# Patient Record
Sex: Female | Born: 1960 | Hispanic: Yes | Marital: Married | Smoking: Never smoker
Health system: Southern US, Community
[De-identification: ages and names within clinical notes are randomized; demographics above are authoritative.]

## PROBLEM LIST (undated history)

## (undated) DIAGNOSIS — E119 Type 2 diabetes mellitus without complications: Secondary | ICD-10-CM

## (undated) DIAGNOSIS — I10 Essential (primary) hypertension: Secondary | ICD-10-CM

---

## 2009-04-29 ENCOUNTER — Ambulatory Visit: Payer: Self-pay | Admitting: Internal Medicine

## 2012-12-04 ENCOUNTER — Ambulatory Visit: Payer: Self-pay | Admitting: Physician Assistant

## 2013-01-03 ENCOUNTER — Ambulatory Visit: Payer: Self-pay | Admitting: Internal Medicine

## 2014-07-26 ENCOUNTER — Emergency Department: Payer: Self-pay | Admitting: Emergency Medicine

## 2014-07-26 LAB — URINALYSIS, COMPLETE
Bilirubin,UR: NEGATIVE
Blood: NEGATIVE
Glucose,UR: >=500 mg/dL (ref 0–75)
KETONE: NEGATIVE
NITRITE: NEGATIVE
Ph: 5 (ref 4.5–8.0)
Protein: NEGATIVE
RBC,UR: 7 /HPF (ref 0–5)
SPECIFIC GRAVITY: 1.04 (ref 1.003–1.030)
Squamous Epithelial: 4

## 2014-07-26 LAB — GC/CHLAMYDIA PROBE AMP

## 2014-07-26 LAB — WET PREP, GENITAL

## 2015-02-12 ENCOUNTER — Other Ambulatory Visit: Payer: Self-pay | Admitting: Internal Medicine

## 2015-02-12 DIAGNOSIS — R748 Abnormal levels of other serum enzymes: Secondary | ICD-10-CM

## 2015-02-16 ENCOUNTER — Ambulatory Visit: Payer: No Typology Code available for payment source

## 2016-01-02 ENCOUNTER — Emergency Department: Payer: Self-pay

## 2016-01-02 ENCOUNTER — Encounter: Payer: Self-pay | Admitting: Emergency Medicine

## 2016-01-02 ENCOUNTER — Emergency Department
Admission: EM | Admit: 2016-01-02 | Discharge: 2016-01-03 | Disposition: A | Discharge status: Home / Self Care | Payer: Self-pay | Attending: Emergency Medicine | Admitting: Emergency Medicine

## 2016-01-02 DIAGNOSIS — I10 Essential (primary) hypertension: Secondary | ICD-10-CM | POA: Insufficient documentation

## 2016-01-02 DIAGNOSIS — R51 Headache: Secondary | ICD-10-CM | POA: Insufficient documentation

## 2016-01-02 DIAGNOSIS — R519 Headache, unspecified: Secondary | ICD-10-CM

## 2016-01-02 DIAGNOSIS — R42 Dizziness and giddiness: Secondary | ICD-10-CM

## 2016-01-02 DIAGNOSIS — E860 Dehydration: Secondary | ICD-10-CM | POA: Insufficient documentation

## 2016-01-02 DIAGNOSIS — E119 Type 2 diabetes mellitus without complications: Secondary | ICD-10-CM | POA: Insufficient documentation

## 2016-01-02 HISTORY — DX: Type 2 diabetes mellitus without complications: E11.9

## 2016-01-02 HISTORY — DX: Essential (primary) hypertension: I10

## 2016-01-02 LAB — BASIC METABOLIC PANEL
Anion gap: 9 (ref 5–15)
BUN: 14 mg/dL (ref 6–20)
CHLORIDE: 102 mmol/L (ref 101–111)
CO2: 24 mmol/L (ref 22–32)
CREATININE: 0.63 mg/dL (ref 0.44–1.00)
Calcium: 9.4 mg/dL (ref 8.9–10.3)
GFR calc Af Amer: >60 mL/min (ref >60)
GFR calc non Af Amer: >60 mL/min (ref >60)
GLUCOSE: 165 mg/dL — AB (ref 65–99)
Potassium: 3.9 mmol/L (ref 3.5–5.1)
SODIUM: 135 mmol/L (ref 135–145)

## 2016-01-02 LAB — CBC
HCT: 41.5 % (ref 35.0–47.0)
Hemoglobin: 14.4 g/dL (ref 12.0–16.0)
MCH: 29.2 pg (ref 26.0–34.0)
MCHC: 34.6 g/dL (ref 32.0–36.0)
MCV: 84.3 fL (ref 80.0–100.0)
PLATELETS: 318 10*3/uL (ref 150–440)
RBC: 4.92 MIL/uL (ref 3.80–5.20)
RDW: 13.9 % (ref 11.5–14.5)
WBC: 10.3 10*3/uL (ref 3.6–11.0)

## 2016-01-02 LAB — GLUCOSE, CAPILLARY: GLUCOSE-CAPILLARY: 176 mg/dL — AB (ref 65–99)

## 2016-01-02 LAB — URINALYSIS COMPLETE WITH MICROSCOPIC (ARMC ONLY)
BILIRUBIN URINE: NEGATIVE
Bacteria, UA: NONE SEEN
GLUCOSE, UA: 50 mg/dL — AB
HGB URINE DIPSTICK: NEGATIVE
Nitrite: NEGATIVE
Protein, ur: 30 mg/dL — AB
Specific Gravity, Urine: 1.028 (ref 1.005–1.030)
pH: 5 (ref 5.0–8.0)

## 2016-01-02 MED ORDER — METOCLOPRAMIDE HCL 5 MG/ML IJ SOLN: 10.0000 mg | Freq: Once | INTRAMUSCULAR | Status: DC

## 2016-01-02 MED ORDER — KETOROLAC TROMETHAMINE 30 MG/ML IJ SOLN: 30.0000 mg | Freq: Once | INTRAMUSCULAR | Status: DC

## 2016-01-02 MED ORDER — SODIUM CHLORIDE 0.9 % IV BOLUS (SEPSIS): 1000.0000 mL | Freq: Once | INTRAVENOUS | Status: DC

## 2016-01-02 MED ORDER — IOPAMIDOL (ISOVUE-370) INJECTION 76%
75.0000 mL | Freq: Once | INTRAVENOUS | Status: AC | PRN
  Administered 2016-01-02: 75 mL via INTRAVENOUS

## 2016-01-02 MED ORDER — DIPHENHYDRAMINE HCL 50 MG/ML IJ SOLN: 25.0000 mg | Freq: Once | INTRAMUSCULAR | Status: DC

## 2016-01-02 NOTE — ED Triage Notes (Signed)
Pt presents with family member and states that she is dizzy and feels almost in a "drunk-like" state.  She requests that we take her blood sugar and states that she feels it is running high.  Pt also states that she has high blood pressure.  Pt has been diabetic for 20 years and her blood sugar normally runs 120-140 but has been over 200 since Thursday. Pt states no appetite since Thursday.

## 2016-01-02 NOTE — ED Provider Notes (Signed)
[facility] Emergency Department Provider Note   ____________________________________________   First MD Initiated Contact with Patient 01/02/16 2308     (approximate)  I have reviewed the triage vital signs and the nursing notes.   HISTORY  Chief Complaint Dizziness    HPI Melissa Blair is a 55 y.o. female who comes into the hospital today with dizziness. The patient reports that she's been having these symptoms since Thursday. She reports that she feels lightheaded and dizzy and her body and not having the room spin. The patient has never had this before. She had no new medications aside from her diabetes medication. She also reports she's not been eating or drinking much but she has not had much of an appetite. She denies any abdominal pain. The patient endorses a headache that she rates a 6 out of 10 in intensity. She is also had this since Thursday but she has not taken any medications. The patient does not have any significant history of headaches. She feels weak all over but denies any numbness or tingling. She has no chest pain no blurry vision or shortness of breath or vomiting and diarrhea. The patient was concerned about the symptoms that she decided to come into the hospital for evaluation.   Past Medical History:  Diagnosis Date  . Diabetes mellitus without complication (HCC)   . Hypertension     There are no active problems to display for this patient.   Past Surgical History:  Procedure Laterality Date  . CESAREAN SECTION      Prior to Admission medications   Not on File    Allergies Other  No family history on file.  Social History Social History  Substance Use Topics  . Smoking status: Never Smoker  . Smokeless tobacco: Never Used  . Alcohol use No    Review of Systems Constitutional: No fever/chills Eyes: No visual changes. ENT: No sore throat. Cardiovascular: Denies chest pain. Respiratory: Denies shortness of  breath. Gastrointestinal: No abdominal pain.  No nausea, no vomiting.  No diarrhea.  No constipation. Genitourinary: Negative for dysuria. Musculoskeletal: Negative for back pain. Skin: Negative for rash. Neurological: Dizziness and headache  10-point ROS otherwise negative.  ____________________________________________   PHYSICAL EXAM:  VITAL SIGNS: ED Triage Vitals  Enc Vitals Group     BP 01/02/16 1750 (!) 166/86     Pulse Rate 01/02/16 1750 89     Resp 01/02/16 2139 18     Temp 01/02/16 1750 98.5 F (36.9 C)     Temp Source 01/02/16 1750 Oral     SpO2 01/02/16 1750 98 %     Weight 01/02/16 1751 142 lb (64.4 kg)     Height 01/02/16 1751 5' (1.524 m)     Head Circumference --      Peak Flow --      Pain Score 01/02/16 1752 6     Pain Loc --      Pain Edu? --      Excl. in GC? --     Constitutional: Alert and oriented. Well appearing and in mild distress. Eyes: Conjunctivae are normal. PERRL. EOMI. Head: Atraumatic. Nose: No congestion/rhinnorhea. Mouth/Throat: Mucous membranes are moist.  Oropharynx non-erythematous. Cardiovascular: Normal rate, regular rhythm. Grossly normal heart sounds.  Good peripheral circulation. Respiratory: Normal respiratory effort.  No retractions. Lungs CTAB. Gastrointestinal: Soft and nontender. No distention. Positive bowel sounds Musculoskeletal: No lower extremity tenderness nor edema.   Neurologic:  Normal speech and language. Cranial nerves  II through XII are grossly intact with no focal or neuro deficits Skin:  Skin is warm, dry and intact. No rash noted. Psychiatric: Mood and affect are normal.   ____________________________________________   LABS (all labs ordered are listed, but only abnormal results are displayed)  Labs Reviewed  GLUCOSE, CAPILLARY - Abnormal; Notable for the following:       Result Value   Glucose-Capillary 176 (*)    All other components within normal limits  BASIC METABOLIC PANEL - Abnormal; Notable  for the following:    Glucose, Bld 165 (*)    All other components within normal limits  URINALYSIS COMPLETEWITH MICROSCOPIC ([facility] ONLY) - Abnormal; Notable for the following:    Color, Urine YELLOW (*)    APPearance HAZY (*)    Glucose, UA 50 (*)    Ketones, ur 1+ (*)    Protein, ur 30 (*)    Leukocytes, UA 1+ (*)    Squamous Epithelial / LPF 0-5 (*)    All other components within normal limits  CBC  TROPONIN I  TROPONIN I  CBG MONITORING, ED   ____________________________________________  EKG  ED ECG REPORT I, Rebecka Apley, the attending physician, personally viewed and interpreted this ECG.   Date: 01/02/2016  EKG Time: 1811  Rate: 86  Rhythm: normal sinus rhythm  Axis: Normal  Intervals:none  ST&T Change: none  ____________________________________________  RADIOLOGY  CT head ____________________________________________   PROCEDURES  Procedure(s) performed: None  Procedures  Critical Care performed: No  ____________________________________________   INITIAL IMPRESSION / ASSESSMENT AND PLAN / ED COURSE  Pertinent labs & imaging results that were available during my care of the patient were reviewed by me and considered in my medical decision making (see chart for details).  This is a 55 year old female who comes into the hospital today with some dizziness. The patient had some blood work and a CT head that was unremarkable. I will add on a troponin and a CTA. I will give the patient some Reglan, Benadryl and Toradol as well as a liter of normal saline. I will reassess the patient once I have the results of her blood work.  Clinical Course  Value Comment By Time  CT Head Wo Contrast No acute intracranial abnormalities Rebecka Apley, MD 09/02 2306  CT Angio Head W or Wo Contrast 1. No acute abnormality within the major arterial vasculature of the head. No large or proximal arterial branch occlusion. No high-grade or correctable stenosis. 2.  Mild atheromatous plaque within the the cavernous right ICA without significant stenosis. No other significant atheromatous disease within the intracranial circulation.   Rebecka Apley, MD 09/03 0104   The patient reports that her symptoms are improved after the medication. She did ask if there was any medication she will receive for home. I informed her that she needs to make sure she is eating and drinking which will help with her dizzy symptoms. I informed her that she should also follow-up with her primary care physician. Although the patient seemed confused that I would not give her any medications I again encouraged her that this was likely due to the fact that she was not eating or drinking much over the past few days. The patient will be discharged home to follow-up with her primary care physician. Her troponins are negative and the remainder of her blood work is unremarkable.  ____________________________________________   FINAL CLINICAL IMPRESSION(S) / ED DIAGNOSES  Final diagnoses:  Dizziness  Dehydration  Headache, unspecified headache type      NEW MEDICATIONS STARTED DURING THIS VISIT:  New Prescriptions   No medications on file     Note:  This document was prepared using Dragon voice recognition software and may include unintentional dictation errors.    Rebecka ApleyAllison P Felicha Frayne, MD 01/03/16 [phone]

## 2016-01-02 NOTE — ED Notes (Addendum)
Patient given orange juice and graham crackers.

## 2016-01-02 NOTE — ED Notes (Signed)
Interpreter Rafael at bedside. Patient states dizziness started on Thursday and has been constant. Patient states legs feel heavy since Thursday. Patient also c/o poor appetite and increased fatigue since Thursday. Patient also c/o bilateral foot pain.

## 2016-01-02 NOTE — ED Notes (Signed)
CT called to inquire about an IV site. None noted from previous RN. Will place 20g above the wrist for CT angio.

## 2016-01-03 LAB — TROPONIN I

## 2016-06-30 ENCOUNTER — Ambulatory Visit
Admission: EM | Admit: 2016-06-30 | Discharge: 2016-06-30 | Disposition: A | Discharge status: Home / Self Care | Payer: BLUE CROSS/BLUE SHIELD | Attending: Physician Assistant | Admitting: Physician Assistant

## 2016-06-30 ENCOUNTER — Encounter: Payer: Self-pay | Admitting: *Deleted

## 2016-06-30 DIAGNOSIS — B372 Candidiasis of skin and nail: Secondary | ICD-10-CM

## 2016-06-30 DIAGNOSIS — R21 Rash and other nonspecific skin eruption: Secondary | ICD-10-CM

## 2016-06-30 MED ORDER — NYSTATIN 100000 UNIT/GM EX POWD
Freq: Three times a day (TID) | CUTANEOUS | 0 refills | Status: AC
Start: 2016-06-30 — End: ?

## 2016-06-30 MED ORDER — NYSTATIN-TRIAMCINOLONE 100000-0.1 UNIT/GM-% EX OINT: 1.0000 "application " | TOPICAL_OINTMENT | Freq: Two times a day (BID) | CUTANEOUS | 0 refills | Status: DC

## 2016-06-30 NOTE — Discharge Instructions (Signed)
-  Use the prescribed medications as directed until healing is complete. -Follow up with PCP should symptoms worsen for not improve after 7-10 days

## 2016-06-30 NOTE — ED Triage Notes (Signed)
Rash under breasts and to groin.

## 2016-06-30 NOTE — ED Provider Notes (Signed)
CSN: [id number]     Arrival date & time 06/30/16  0940 History   None    Chief Complaint  Patient presents with  . Rash   The history is provided by the patient. A language interpreter was used ([place]  [phone]).  Rash  Location: under both breasts in in groin area. Quality: itchiness and redness   Duration:  3 weeks Timing:  Constant Progression:  Unchanged Chronicity:  New Context comment:  No new detergents or fabric softeners, skin products, or soaps. Seems to have started after she wore a new top but she hasn't worn that top since. Ineffective treatments: corn flour. Associated symptoms: no abdominal pain, no fever, no nausea and not vomiting   Associated symptoms comment:  Denies abdominal pain. No vaginal discharge or foul odor. Pt denies dysuria, hematuria, frequency.    Past Medical History:  Diagnosis Date  . Diabetes mellitus without complication [facility])    Past Surgical History:  Procedure Laterality Date  . CESAREAN SECTION     History reviewed. No pertinent family history. Social History  Substance Use Topics  . Smoking status: Never Smoker  . Smokeless tobacco: Never Used  . Alcohol use No   OB History    No data available     Review of Systems  Constitutional: Negative for chills and fever.  HENT: Negative.   Respiratory: Negative.   Cardiovascular: Negative.   Gastrointestinal: Negative for abdominal pain, nausea and vomiting.  Genitourinary: Negative for dysuria, frequency, hematuria, vaginal discharge and vaginal pain.  Musculoskeletal: Negative.   Skin: Positive for rash.  Neurological: Negative.     Allergies  Other  Home Medications   Prior to Admission medications   Medication Sig Start Date End Date Taking? Authorizing Provider  nystatin (MYCOSTATIN/NYSTOP) powder Apply topically 3 (three) times daily. Apply to skin areas under breast and skin creases between legs until healing complete. 06/30/16   Candis Schatz, PA-C   nystatin-triamcinolone ointment [facility]) Apply 1 application topically 2 (two) times daily. Apply to rash areas between breasts until healing complete. 06/30/16   Candis Schatz, PA-C   Meds Ordered and Administered this Visit  Medications - No data to display  BP (!) 150/77 (BP Location: Left Arm)   Pulse 77   Temp 97.6 F (36.4 C) (Oral)   Resp 16   SpO2 99%  No data found.   Physical Exam  Constitutional: She is oriented to person, place, and time. She appears well-developed and well-nourished.  HENT:  Head: Normocephalic and atraumatic.  Eyes: EOM are normal. Pupils are equal, round, and reactive to light.  Neck: Normal range of motion.  Cardiovascular: Normal rate, regular rhythm, normal heart sounds and intact distal pulses.   Pulmonary/Chest: Effort normal and breath sounds normal.  Abdominal: Soft. Bowel sounds are normal.  Neurological: She is alert and oriented to person, place, and time.  Skin: Rash (underneath both breasts and between breasts. rash in groin area skin creases between legs) noted.    Urgent Care Course     Procedures: none  Labs Review Labs Reviewed - No data to display  Imaging Review No results found.     MDM   1. Rash   2. Candidiasis of skin    Candidiasis rash under breaths and in skin creases between legs, itchy without pain. No abdominal or GU associated symptoms. Will give nystatin powder for moist areas under breasts and in skin creases and nystatin cream for areas between breasts. Pt agreed with  plan.   Candis SchatzMichael D Muslima Toppins, PA-C 06/30/16 1118

## 2016-08-04 ENCOUNTER — Emergency Department
Admission: EM | Admit: 2016-08-04 | Discharge: 2016-08-04 | Disposition: A | Discharge status: Home / Self Care | Payer: BLUE CROSS/BLUE SHIELD | Attending: Emergency Medicine | Admitting: Emergency Medicine

## 2016-08-04 ENCOUNTER — Encounter: Payer: Self-pay | Admitting: Emergency Medicine

## 2016-08-04 DIAGNOSIS — R21 Rash and other nonspecific skin eruption: Secondary | ICD-10-CM | POA: Diagnosis present

## 2016-08-04 DIAGNOSIS — E1165 Type 2 diabetes mellitus with hyperglycemia: Secondary | ICD-10-CM | POA: Diagnosis not present

## 2016-08-04 DIAGNOSIS — E871 Hypo-osmolality and hyponatremia: Secondary | ICD-10-CM | POA: Diagnosis not present

## 2016-08-04 DIAGNOSIS — B372 Candidiasis of skin and nail: Secondary | ICD-10-CM | POA: Diagnosis not present

## 2016-08-04 DIAGNOSIS — R739 Hyperglycemia, unspecified: Secondary | ICD-10-CM

## 2016-08-04 LAB — BASIC METABOLIC PANEL
Anion gap: 9 (ref 5–15)
BUN: 16 mg/dL (ref 6–20)
CALCIUM: 9.5 mg/dL (ref 8.9–10.3)
CHLORIDE: 95 mmol/L — AB (ref 101–111)
CO2: 27 mmol/L (ref 22–32)
CREATININE: 0.57 mg/dL (ref 0.44–1.00)
GFR calc non Af Amer: >60 mL/min (ref >60)
Glucose, Bld: 520 mg/dL (ref 65–99)
Potassium: 4 mmol/L (ref 3.5–5.1)
SODIUM: 131 mmol/L — AB (ref 135–145)

## 2016-08-04 LAB — CBC
HCT: 43 % (ref 35.0–47.0)
Hemoglobin: 14.4 g/dL (ref 12.0–16.0)
MCH: 28.5 pg (ref 26.0–34.0)
MCHC: 33.5 g/dL (ref 32.0–36.0)
MCV: 84.9 fL (ref 80.0–100.0)
PLATELETS: 320 10*3/uL (ref 150–440)
RBC: 5.06 MIL/uL (ref 3.80–5.20)
RDW: 13.8 % (ref 11.5–14.5)
WBC: 9.4 10*3/uL (ref 3.6–11.0)

## 2016-08-04 LAB — URINALYSIS, COMPLETE (UACMP) WITH MICROSCOPIC
BACTERIA UA: NONE SEEN
BILIRUBIN URINE: NEGATIVE
Glucose, UA: >=500 mg/dL — AB
HGB URINE DIPSTICK: NEGATIVE
Ketones, ur: NEGATIVE mg/dL
Nitrite: NEGATIVE
PROTEIN: NEGATIVE mg/dL
Specific Gravity, Urine: 1.025 (ref 1.005–1.030)
pH: 7 (ref 5.0–8.0)

## 2016-08-04 LAB — GLUCOSE, CAPILLARY
Glucose-Capillary: 502 mg/dL (ref 65–99)
Glucose-Capillary: 368 mg/dL — ABNORMAL HIGH (ref 65–99)

## 2016-08-04 MED ORDER — INSULIN ASPART 100 UNIT/ML ~~LOC~~ SOLN
10.0000 [IU] | Freq: Once | SUBCUTANEOUS | Status: AC
  Administered 2016-08-04: 10 [IU] via SUBCUTANEOUS
  Filled 2016-08-04: qty 10

## 2016-08-04 MED ORDER — SODIUM CHLORIDE 0.9 % IV BOLUS (SEPSIS)
1000.0000 mL | Freq: Once | INTRAVENOUS | Status: AC
  Administered 2016-08-04: 1000 mL via INTRAVENOUS

## 2016-08-04 MED ORDER — METOPROLOL TARTRATE 25 MG PO TABS
25.0000 mg | ORAL_TABLET | Freq: Once | ORAL | Status: AC
  Administered 2016-08-04: 25 mg via ORAL
  Filled 2016-08-04: qty 1

## 2016-08-04 NOTE — ED Triage Notes (Signed)
Pt ambulatory to triage in NAD, w/ help of interpreter, states sent by PCP for insulin, pt's CBG 502 in triage.  Pt also reports rash under breast and groin.

## 2016-08-04 NOTE — ED Provider Notes (Signed)
[facility] Emergency Department Provider Note  ____________________________________________  Time seen: Approximately 8:55 PM  I have reviewed the triage vital signs and the nursing notes.   HISTORY  Chief Complaint Hyperglycemia and Rash    HPI Melissa Blair is a 56 y.o. female with a history of DM on oral anti-hyperglycemics presenting with cutaneous candidiasis and hyperglycemia. The patient reports that she has had a rash beneath both breasts for several weeks. Today she went to see her PMD who prescribed a cream for her, and recommended that she come to the emergency department for treatment of her hyperglycemia. On arrival to the emergency department her blood sugars in the 500s. She does not remember the names of her diabetes or blood pressure medications, and only the first name of her physician but not the last name.  The patient denies any recent illness including cough or cold symptoms, dysuria, polyuria or polydipsia, nausea vomiting or diarrhea, fever or chills  Past Medical History:  Diagnosis Date  . Diabetes mellitus without complication (HCC)     There are no active problems to display for this patient.   Past Surgical History:  Procedure Laterality Date  . CESAREAN SECTION      Current Outpatient Rx  . Order #: [id number] Class: Print  . Order #: [id number] Class: Normal    Allergies Other and Penicillins  History reviewed. No pertinent family history.  Social History Social History  Substance Use Topics  . Smoking status: Never Smoker  . Smokeless tobacco: Never Used  . Alcohol use No    Review of Systems Constitutional: No fever/chills.No lightheadedness or syncope. Eyes: No visual changes. ENT: No sore throat. No congestion or rhinorrhea. Cardiovascular: Denies chest pain. Denies palpitations. Respiratory: Denies shortness of breath.  No cough. Gastrointestinal: No abdominal pain.  No nausea, no vomiting.  No diarrhea.   No constipation. Genitourinary: Negative for dysuria. Musculoskeletal: Negative for back pain. Skin: Positive for rash. Neurological: Negative for headaches. No focal numbness, tingling or weakness.  Endocrine:Positive for hyperglycemia. 10-point ROS otherwise negative.  ____________________________________________   PHYSICAL EXAM:  VITAL SIGNS: ED Triage Vitals [08/04/16 1943]  Enc Vitals Group     BP (!) 194/76     Pulse Rate 83     Resp 18     Temp 98.7 F (37.1 C)     Temp Source Oral     SpO2 98 %     Weight 150 lb (68 kg)     Height  (1.6 m)     Head Circumference      Peak Flow      Pain Score 0     Pain Loc      Pain Edu?      Excl. in GC?     Constitutional: Alert and oriented. No scleral appearing and in no acute distress. Answers questions appropriately. Eyes: Conjunctivae are normal.  EOMI. No scleral icterus. Head: Atraumatic. Nose: No congestion/rhinnorhea. Mouth/Throat: Mucous membranes are moist.  Neck: No stridor.  Supple.  No JVD. No meningismus. Cardiovascular: Normal rate, regular rhythm. No murmurs, rubs or gallops.  Respiratory: Normal respiratory effort.  No accessory muscle use or retractions. Lungs CTAB.  No wheezes, rales or ronchi. Gastrointestinal: Soft, nontender and nondistended.  No guarding or rebound.  No peritoneal signs. Musculoskeletal: No LE edema.  Neurologic:  A&Ox3.  Speech is clear.  Face and smile are symmetric.  EOMI.  Moves all extremities well. Skin:  Skin is warm, dry and intact. Patient has  a confluent erythematous patchy rash under the bilateral breasts with small satellite erythematous patches consistent with cutaneous candidiasis Psychiatric: Mood and affect are normal. Speech and behavior are normal.  Normal judgement.  ____________________________________________   LABS (all labs ordered are listed, but only abnormal results are displayed)  Labs Reviewed  GLUCOSE, CAPILLARY - Abnormal; Notable for the  following:       Result Value   Glucose-Capillary 502 (*)    All other components within normal limits  BASIC METABOLIC PANEL - Abnormal; Notable for the following:    Sodium 131 (*)    Chloride 95 (*)    Glucose, Bld 520 (*)    All other components within normal limits  CBC  URINALYSIS, COMPLETE (UACMP) WITH MICROSCOPIC  CBG MONITORING, ED  CBG MONITORING, ED   ____________________________________________  EKG  Not indicated ____________________________________________  RADIOLOGY  No results found.  ____________________________________________   PROCEDURES  Procedure(s) performed: None  Procedures  Critical Care performed: No ____________________________________________   INITIAL IMPRESSION / ASSESSMENT AND PLAN / ED COURSE  Pertinent labs & imaging results that were available during my care of the patient were reviewed by me and considered in my medical decision making (see chart for details).  56 y.o. female with a history of diabetes on anti-hyperglycemics orally presenting with hyperglycemia and cutaneous candidiasis below the breast. Overall, the patient's vital signs are reassuring although she is mildly hypertensive. She is not taken her blood pressure medication tonight but does not remember what she takes I will treat her with metoprolol. She has received 1 L of intravenous normal saline and I'll recheck her blood sugar and treat her with insulin. Her blood work is reassuring and she is not in DKA. Once her blood sugar has improved, we'll plan to discharge her home and I have asked her to continue her oral anti-hyperglycemics. I have asked her to speak with her primary care physician if the person would like to start her on insulin but we will not do so here. She will continue with the nystatin cream for her candidiasis. There are no additional signs or symptoms of other acute infection or illness causing her  hypoglycemia.  ____________________________________________  FINAL CLINICAL IMPRESSION(S) / ED DIAGNOSES  Final diagnoses:  Hyperglycemia  Cutaneous candidiasis         NEW MEDICATIONS STARTED DURING THIS VISIT:  New Prescriptions   No medications on file      Rockne Menghini, MD 08/04/16 2101

## 2016-08-04 NOTE — Discharge Instructions (Signed)
Please continue to take all your medications as prescribed. Please talk to your doctor about whether you need any changes in the doses of your medications, or new medications. Continue to take the nystatin cream as prescribed for the rash under breasts.  Return to the emergency department if you develop severe pain, nausea or vomiting, fever, or any other symptoms concerning to you.

## 2016-08-04 NOTE — ED Notes (Signed)
md in with pt .  meds given.  Family with pt.  Pt alert.  No pain.  No n/v/d.  Pt states she has a rash beneath both breast that itches.  nsr on monitor.  Skin warm and dry

## 2016-09-19 ENCOUNTER — Ambulatory Visit
Admission: EM | Admit: 2016-09-19 | Discharge: 2016-09-19 | Disposition: A | Discharge status: Home / Self Care | Payer: BLUE CROSS/BLUE SHIELD | Attending: Family Medicine | Admitting: Family Medicine

## 2016-09-19 DIAGNOSIS — H00011 Hordeolum externum right upper eyelid: Secondary | ICD-10-CM

## 2016-09-19 MED ORDER — SULFAMETHOXAZOLE-TRIMETHOPRIM 800-160 MG PO TABS: 1.0000 | ORAL_TABLET | Freq: Two times a day (BID) | ORAL | 0 refills | Status: AC

## 2016-09-19 NOTE — Discharge Instructions (Signed)
Lienzo/trapo con aqua tibia Tylenol or Motrin

## 2016-09-19 NOTE — ED Triage Notes (Addendum)
Pt c/o right eye pain that started on Friday, it is red and swollen and it really itches. Denies any vision changes.

## 2016-09-19 NOTE — ED Notes (Signed)
Spanish Interpreter used via [employer]

## 2016-09-19 NOTE — ED Provider Notes (Signed)
[facility]    CSN: [id number] Arrival date & time: 09/19/16  1645     History   Chief Complaint Chief Complaint  Patient presents with  . Eye Problem    HPI Melissa Blair is a 56 y.o. female.    Eye Problem  Location:  Right eye (right upper eyelid) Quality:  Aching Severity:  Moderate Onset quality:  Sudden Duration:  3 days Timing:  Constant Progression:  Worsening Chronicity:  New Context: not burn, not chemical exposure, not contact lens problem, not direct trauma, not foreign body, not using machinery, not scratch, not smoke exposure and not UV exposure   Relieved by:  None tried Ineffective treatments:  None tried Associated symptoms: crusting, discharge and itching   Associated symptoms: no blurred vision, no decreased vision, no double vision, no facial rash, no headaches, no nausea, no numbness, no photophobia, no scotomas, no swelling, no tearing, no tingling, no vomiting and no weakness   Risk factors: no conjunctival hemorrhage, no exposure to pinkeye, no previous injury to eye, no recent herpes zoster and no recent URI     Past Medical History:  Diagnosis Date  . Diabetes mellitus without complication (HCC)   . Hypertension     There are no active problems to display for this patient.   Past Surgical History:  Procedure Laterality Date  . CESAREAN SECTION      OB History    No data available       Home Medications    Prior to Admission medications   Medication Sig Start Date End Date Taking? Authorizing Provider  lisinopril-hydrochlorothiazide (PRINZIDE,ZESTORETIC) 10-12.5 MG tablet Take 1 tablet by mouth daily.   Yes [provider]  metFORMIN (GLUCOPHAGE) 500 MG tablet Take by mouth 2 (two) times daily with a meal.   Yes [provider]  nystatin (MYCOSTATIN/NYSTOP) powder Apply topically 3 (three) times daily. Apply to skin areas under breast and skin creases between legs until healing complete. 06/30/16    Candis SchatzHarris, Michael D, PA-C  sulfamethoxazole-trimethoprim (BACTRIM DS,SEPTRA DS) 800-160 MG tablet Take 1 tablet by mouth 2 (two) times daily. 09/19/16   Payton Mccallumonty, Terrence Pizana, MD    Family History History reviewed. No pertinent family history.  Social History Social History  Substance Use Topics  . Smoking status: Never Smoker  . Smokeless tobacco: Never Used  . Alcohol use No     Allergies   Other and Penicillins   Review of Systems Review of Systems  Eyes: Positive for discharge and itching. Negative for blurred vision, double vision and photophobia.  Gastrointestinal: Negative for nausea and vomiting.  Neurological: Negative for tingling, weakness, numbness and headaches.     Physical Exam Triage Vital Signs ED Triage Vitals  Enc Vitals Group     BP 09/19/16 1754 (!) 148/82     Pulse Rate 09/19/16 1754 84     Resp 09/19/16 1754 18     Temp 09/19/16 1754 97.6 F (36.4 C)     Temp Source 09/19/16 1754 Oral     SpO2 09/19/16 1754 98 %     Weight 09/19/16 1755 130 lb (59 kg)     Height 09/19/16 1755 5' 4.17" (1.63 m)     Head Circumference --      Peak Flow --      Pain Score 09/19/16 1756 10     Pain Loc --      Pain Edu? --      Excl. in GC? --  No data found.   Updated Vital Signs BP (!) 148/82 (BP Location: Left Arm)   Pulse 84   Temp 97.6 F (36.4 C) (Oral)   Resp 18   Ht 5' 4.17" (1.63 m)   Wt 130 lb (59 kg)   SpO2 98%   BMI 22.19 kg/m   Visual Acuity Right Eye Distance:   Left Eye Distance:   Bilateral Distance:    Right Eye Near:   Left Eye Near:    Bilateral Near:     Physical Exam  Constitutional: She appears well-developed and well-nourished. No distress.  Eyes: Conjunctivae and EOM are normal. Pupils are equal, round, and reactive to light. Right eye exhibits discharge and hordeolum (right upper eyelid with surrounding skin erythema and tenderness to palpationi). Left eye exhibits no chemosis, no discharge, no exudate and no hordeolum. No  foreign body present in the left eye.  Skin: She is not diaphoretic.  Nursing note and vitals reviewed.    UC Treatments / Results  Labs (all labs ordered are listed, but only abnormal results are displayed) Labs Reviewed - No data to display  EKG  EKG Interpretation None       Radiology No results found.  Procedures Procedures (including critical care time)  Medications Ordered in UC Medications - No data to display   Initial Impression / Assessment and Plan / UC Course  I have reviewed the triage vital signs and the nursing notes.  Pertinent labs & imaging results that were available during my care of the patient were reviewed by me and considered in my medical decision making (see chart for details).       Final Clinical Impressions(s) / UC Diagnoses   Final diagnoses:  Hordeolum externum of right upper eyelid    New Prescriptions Discharge Medication List as of 09/19/2016  7:15 PM    START taking these medications   Details  sulfamethoxazole-trimethoprim (BACTRIM DS,SEPTRA DS) 800-160 MG tablet Take 1 tablet by mouth 2 (two) times daily., Starting Mon 09/19/2016, Normal       1. diagnosis reviewed with patient 2. rx as per orders above; reviewed possible side effects, interactions, risks and benefits  3. Recommend supportive treatment with warm compresses to area 4. Follow-up prn if symptoms worsen or don't improve   Payton Mccallum, MD 09/19/16 1920

## 2016-10-04 ENCOUNTER — Other Ambulatory Visit: Payer: Self-pay | Admitting: Internal Medicine

## 2016-10-04 DIAGNOSIS — R748 Abnormal levels of other serum enzymes: Secondary | ICD-10-CM

## 2017-04-03 IMAGING — CT CT ANGIO HEAD
4 of 8 series · 16 of 47 positions shown · IV contrast (APPLIED)
Comparison: Prior noncontrast head CT from earlier the same day.

CLINICAL DATA: Initial evaluation for acute dizziness with
headache.

EXAM:
CT ANGIOGRAPHY HEAD
TECHNIQUE: Multidetector CT imaging of the head was performed using the
standard protocol during bolus administration of intravenous
contrast. Multiplanar CT image reconstructions and MIPs were
obtained to evaluate the vascular anatomy.
CONTRAST:  75 cc of Isovue 370.

[Series 5: cta head · axial · 0.47mm/px · z∈[-139,-31]mm · 7 of 74 slices shown]
[im 10/74  brain]
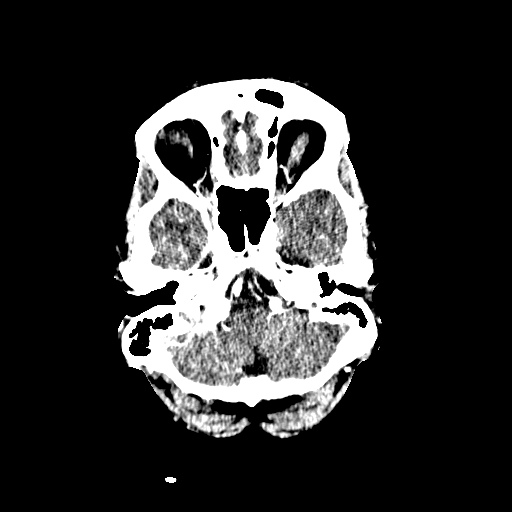
[im 19/74  bone]
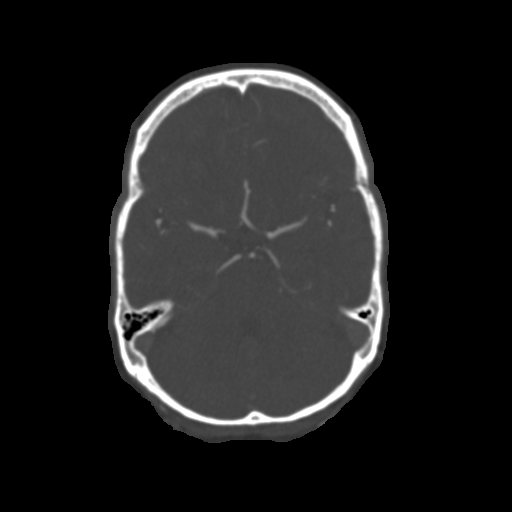
[im 28/74  brain]
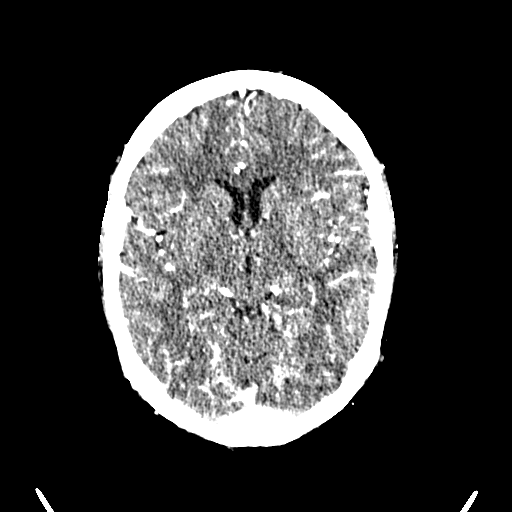
[im 37/74  bone]
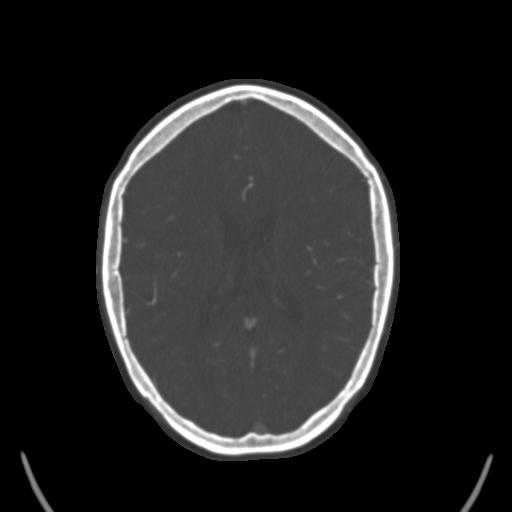
[im 46/74  brain]
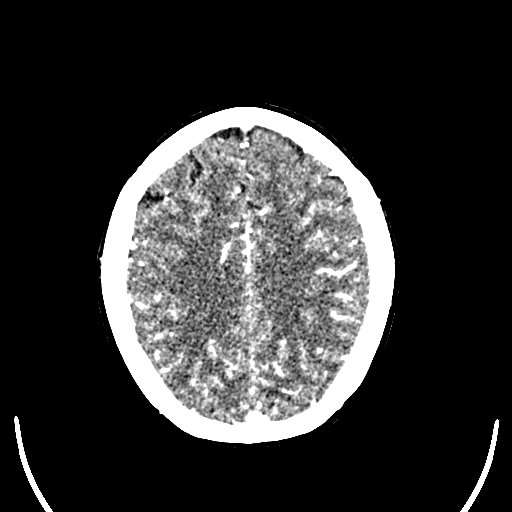
[im 55/74  bone]
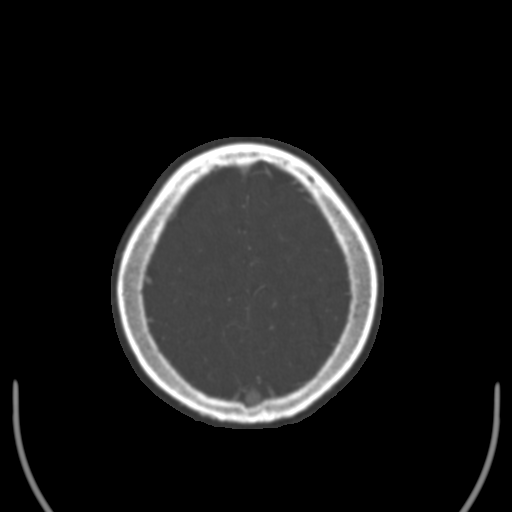
[im 64/74  brain]
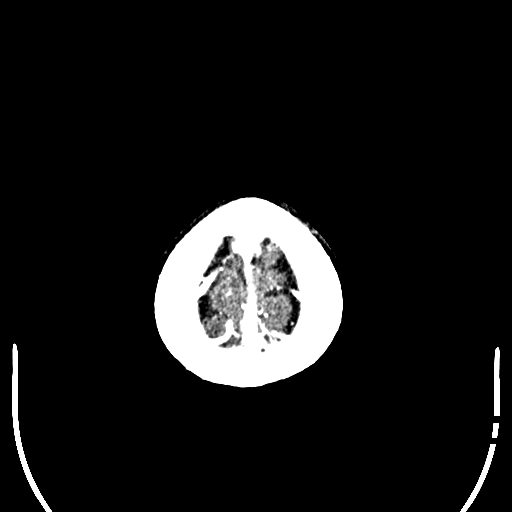

[Series 7: ax thin · axial · 0.39mm/px · z∈[-164,-126]mm · 3 of 137 slices shown]
[im 10/137  brain]
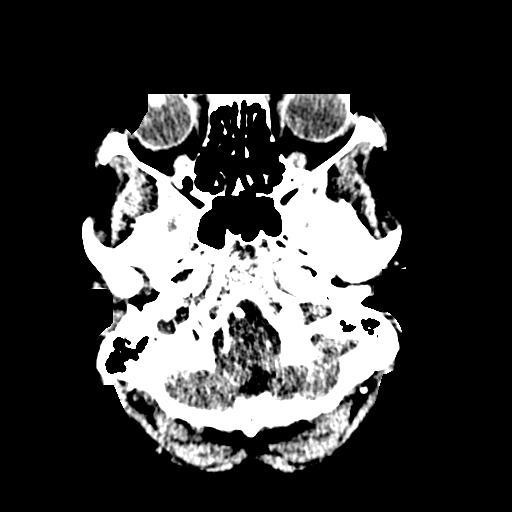
[im 30/137  brain]
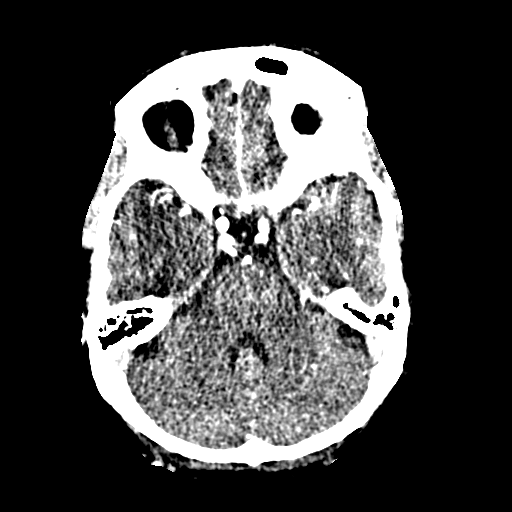
[im 49/137  brain]
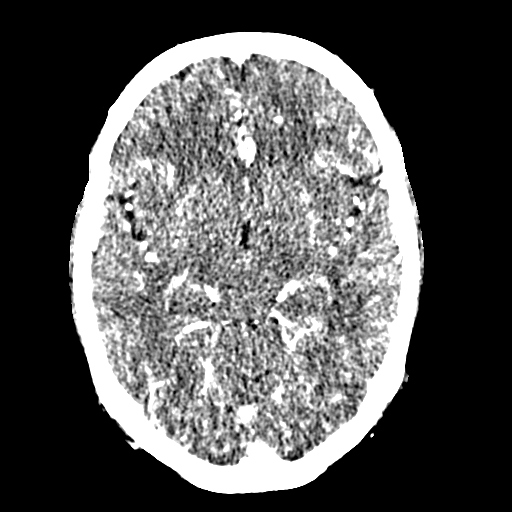

[Series 9: cor thin · coronal · 0.32mm/px · 3 of 175 slices shown]
[im 50/175  brain]
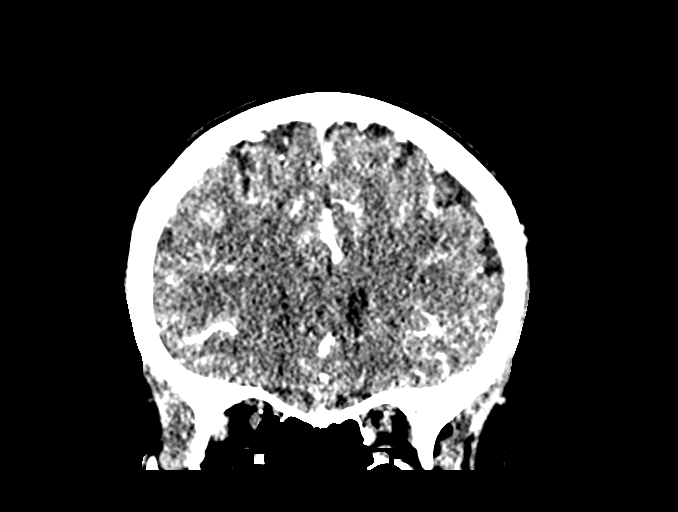
[im 75/175  brain]
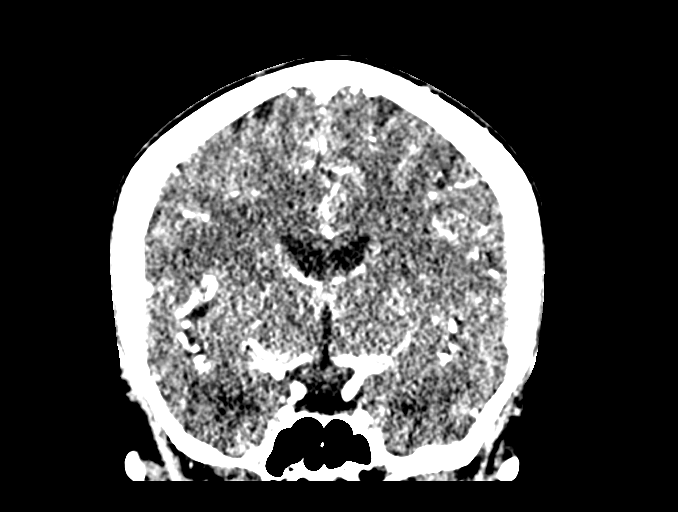
[im 100/175  brain]
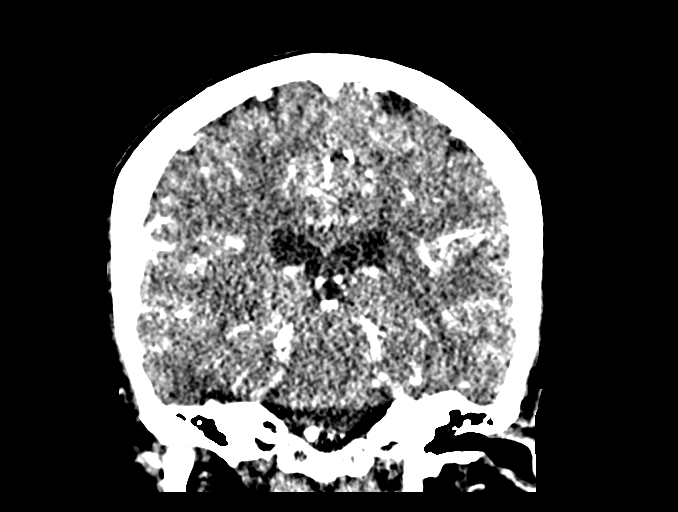

[Series 11: sag thin · sagittal · 0.30mm/px · 3 of 139 slices shown]
[im 28/139  brain]
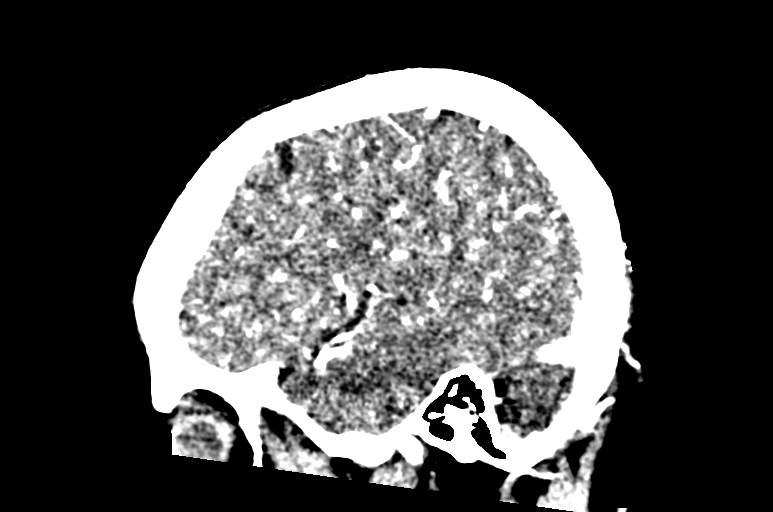
[im 56/139  brain]
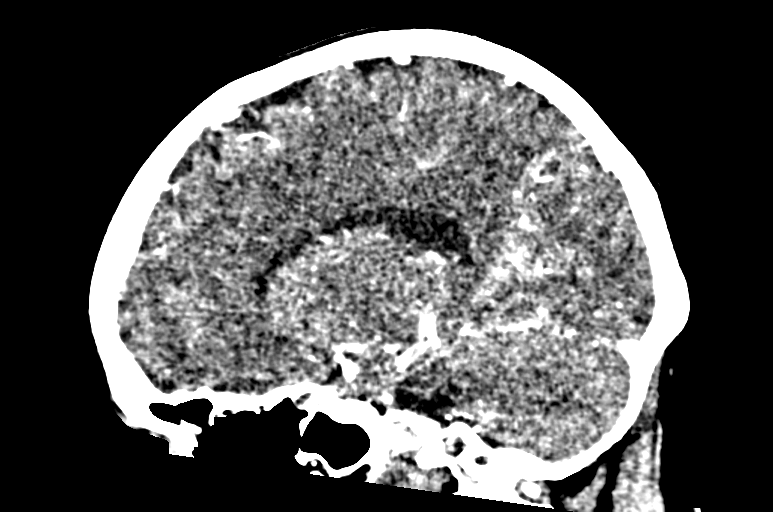
[im 83/139  brain]
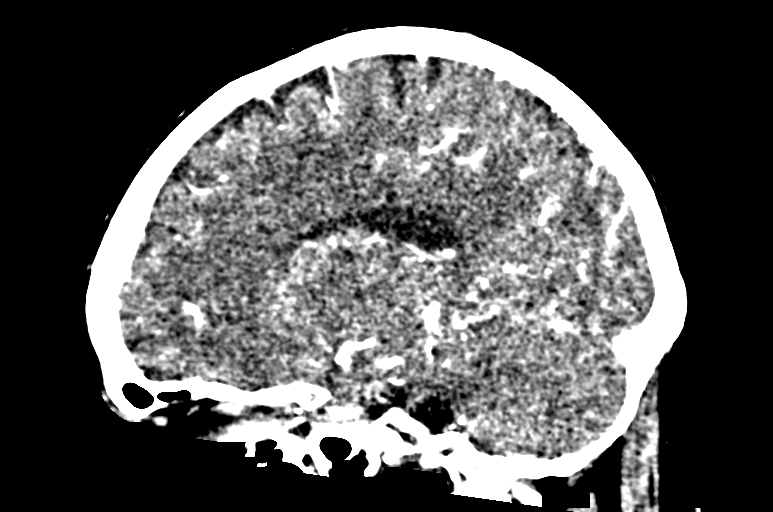

[16 of 47 positions shown; findings below may reference images not displayed]

FINDINGS: CTA HEAD

Anterior circulation: Visualized distal cervical segments of the
internal carotid arteries are widely patent. Petrous segments widely
patent. Mild scattered atheromatous plaque within the cavernous
right ICA without significant stenosis. Cavernous and supraclinoid
ICAs otherwise widely patent without stenosis or other abnormality.
Right A1 segment hypoplastic but patent. Dominant left A1 segment
patent as well. Anterior communicating artery normal. Anterior
cerebral arteries well opacified to their distal aspects. M1
segments widely patent without stenosis or occlusion. Right MCA
bifurcation unremarkable. Left MCA trifurcates. Distal MCA branches
well opacified and symmetric.

Posterior circulation: Left vertebral artery dominant and widely
patent to the vertebrobasilar junction. Right vertebral artery
hypoplastic but patent as well partially visualized posterior
inferior cerebral arteries patent. Basilar artery somewhat
diminutive but patent to its distal aspect. Superior cerebral
arteries patent proximally. Left PCA arises from the basilar artery
and is well opacified to its distal aspect. A small left posterior
communicating artery noted. Fetal type right PCA supplied via a
widely patent right posterior communicating artery. Right PCA also
supplied to its distal aspect.

Venous sinuses: Grossly patent without venous sinus thrombosis.

Anatomic variants: Fetal type right PCA. Hypoplastic right A1
segment. No aneurysm or vascular malformation.

Delayed phase:No pathologic enhancement on delayed sequence.
IMPRESSION: 1. No acute abnormality within the major arterial vasculature of the
head. No large or proximal arterial branch occlusion. No high-grade
or correctable stenosis.
2. Mild atheromatous plaque within the the cavernous right ICA
without significant stenosis. No other significant atheromatous
disease within the intracranial circulation.

## 2017-04-03 IMAGING — CT CT HEAD W/O CM
3 series · 16 of 44 positions shown, 19 images · non-contrast
Comparison: None.

CLINICAL DATA: Dizziness for 3 days. High blood pressure in
diabetic with elevated blood sugar.

EXAM:
CT HEAD WITHOUT CONTRAST
TECHNIQUE: Contiguous axial images were obtained from the base of the skull
through the vertex without intravenous contrast.

[Series 2: head wo · axial · 0.47mm/px · z∈[-146,-36]mm · 10 of 27 slices shown, 13 images]
[im 3/27  brain]
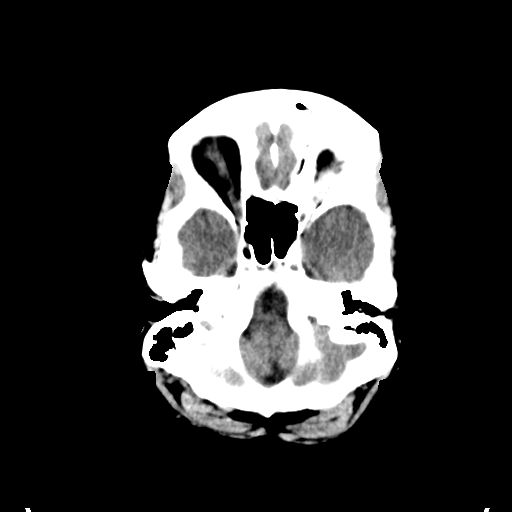
[im 3/27  bone]
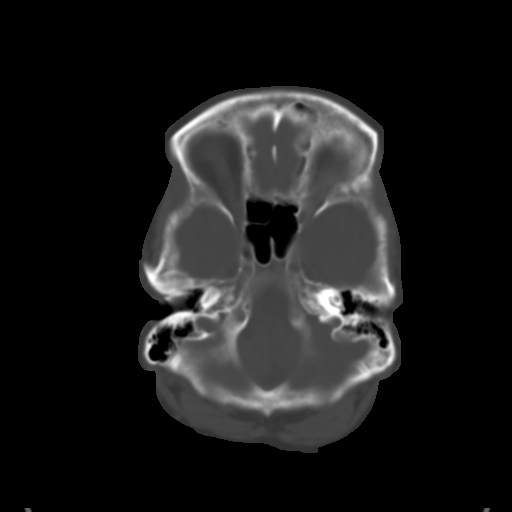
[im 5/27  brain]
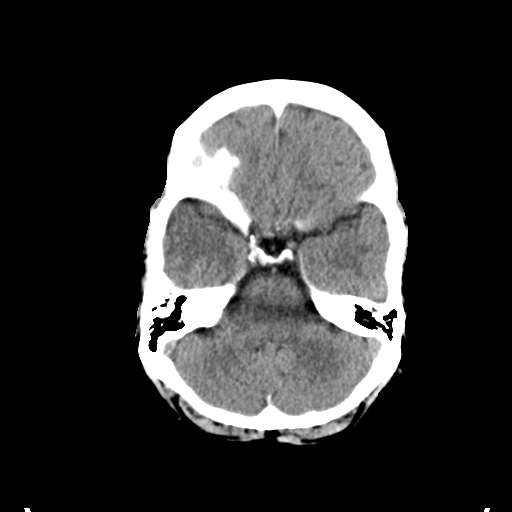
[im 8/27  brain]
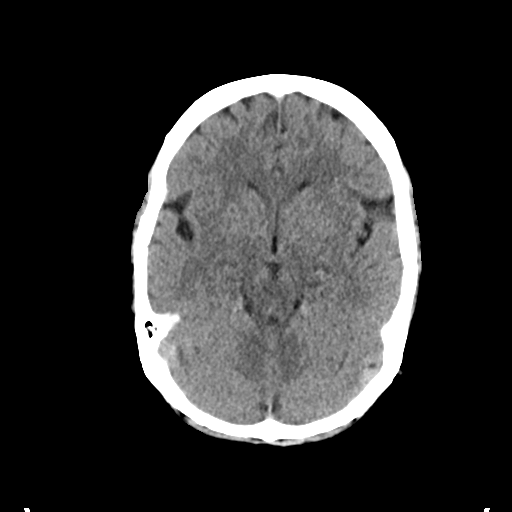
[im 10/27  brain]
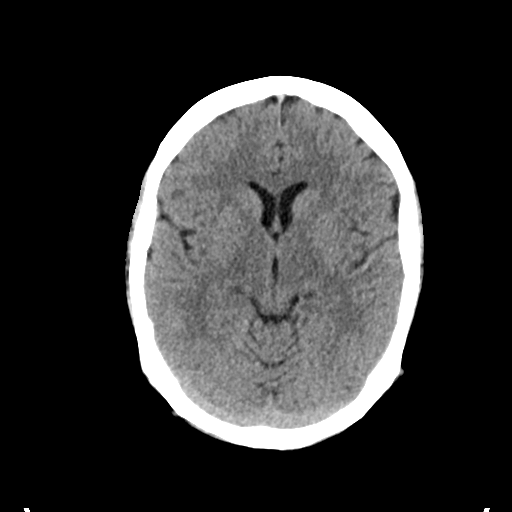
[im 13/27  brain]
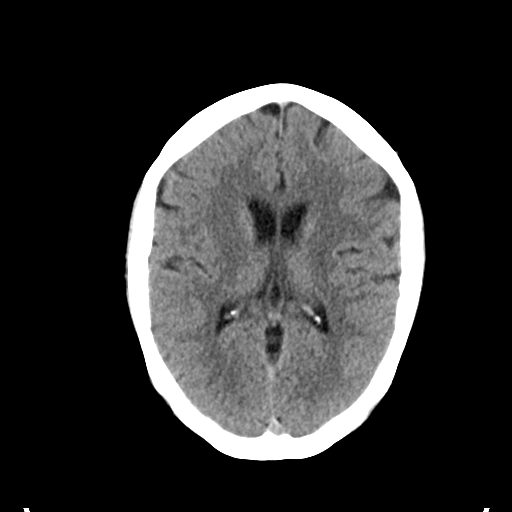
[im 13/27  bone]
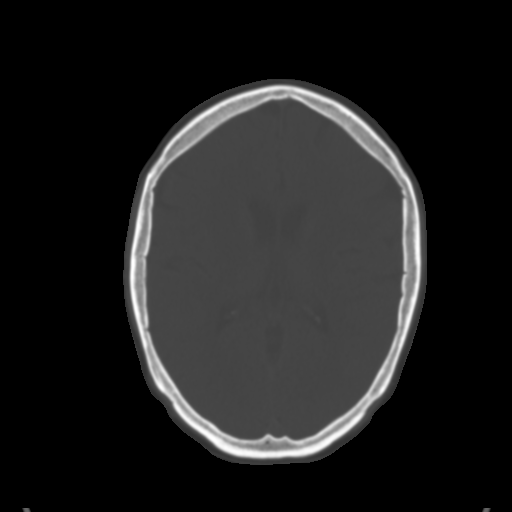
[im 15/27  brain]
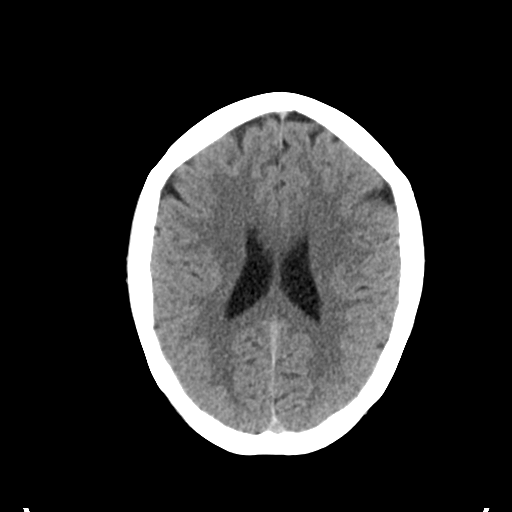
[im 18/27  brain]
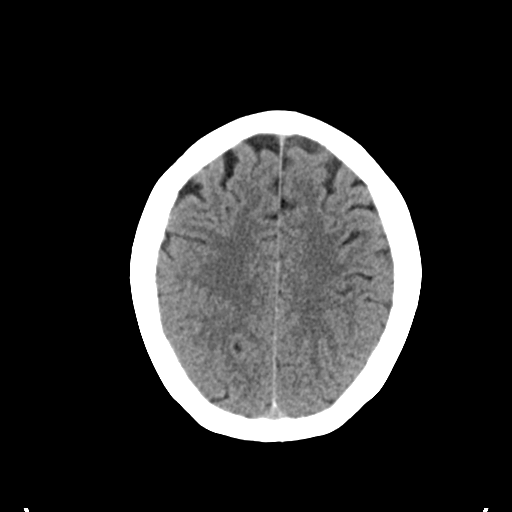
[im 20/27  brain]
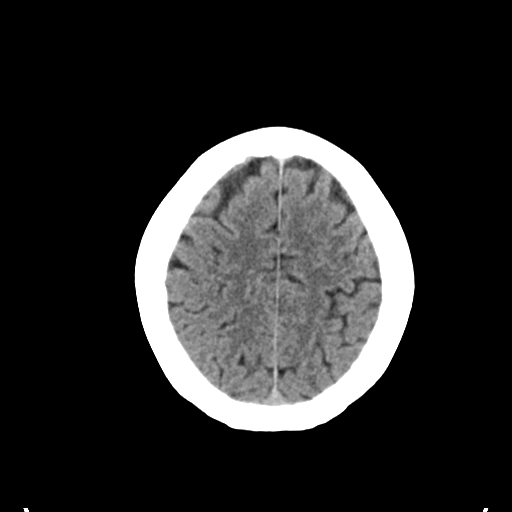
[im 23/27  brain]
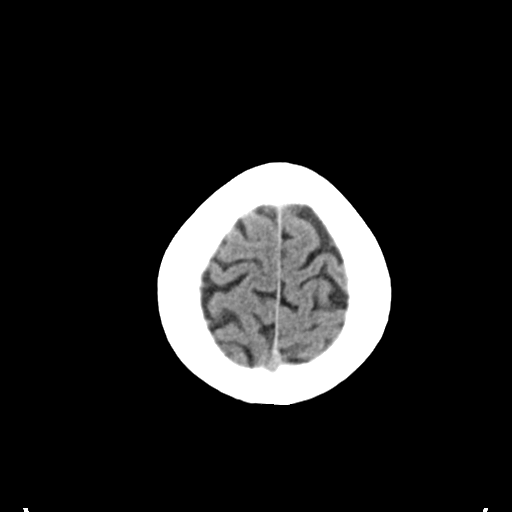
[im 23/27  bone]
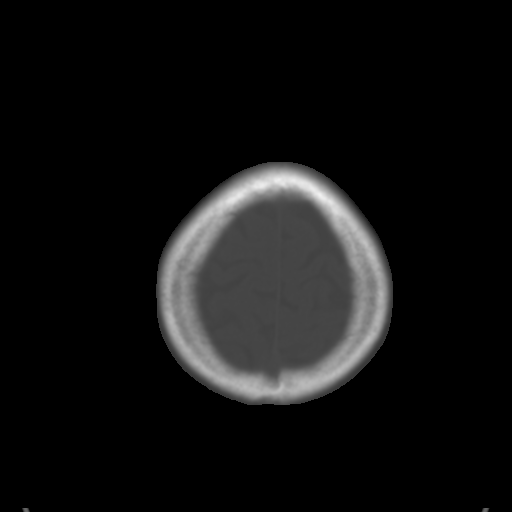
[im 25/27  brain]
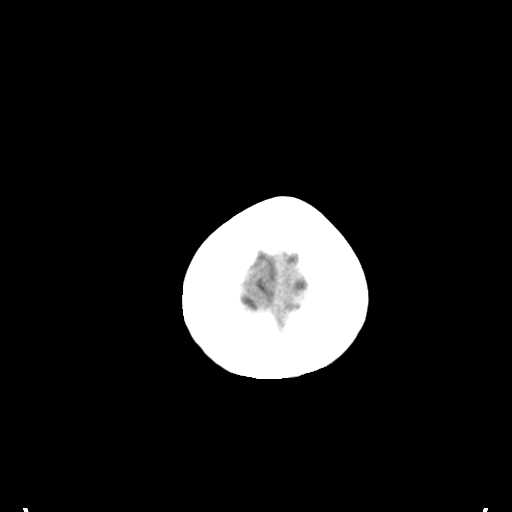

[Series 4: coronal soft tissue · coronal · 0.26mm/px · 3 of 59 slices shown]
[im 20/59  brain]
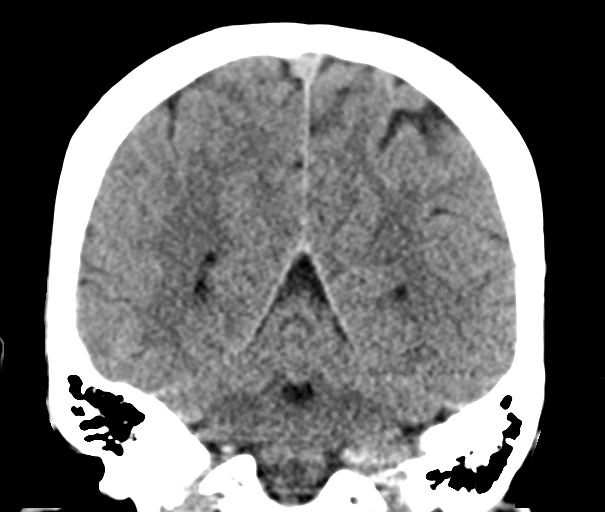
[im 26/59  brain]
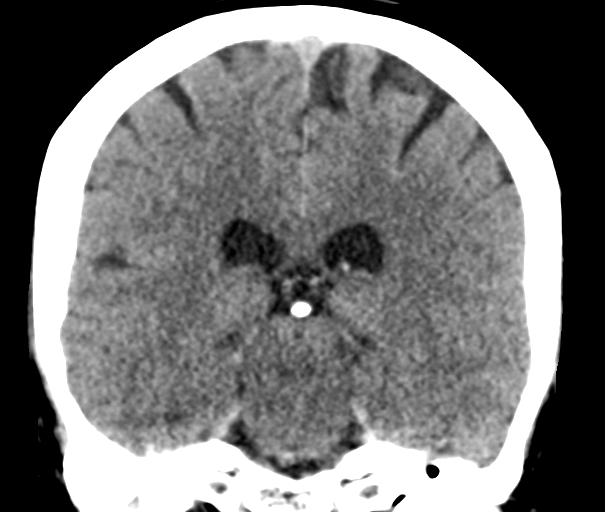
[im 33/59  brain]
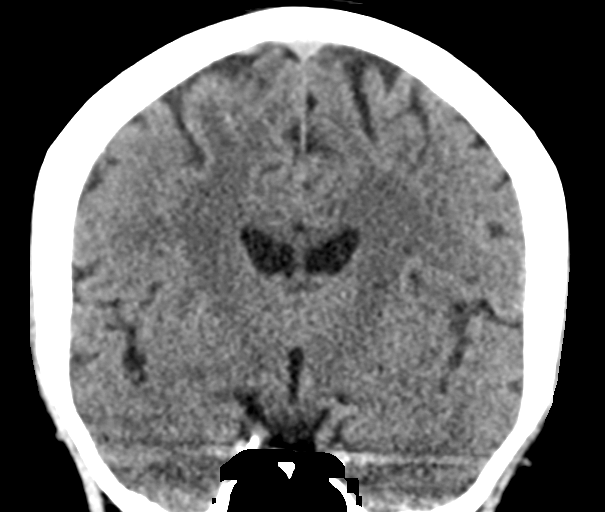

[Series 5: sagittal soft tissue · sagittal · 0.31mm/px · 3 of 49 slices shown]
[im 17/49  brain]
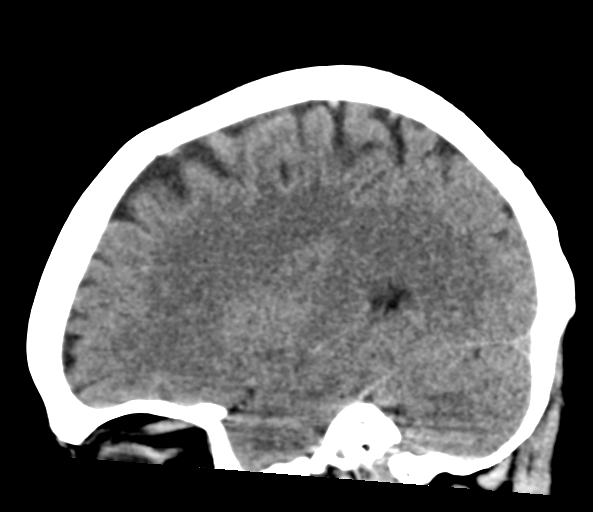
[im 25/49  brain]
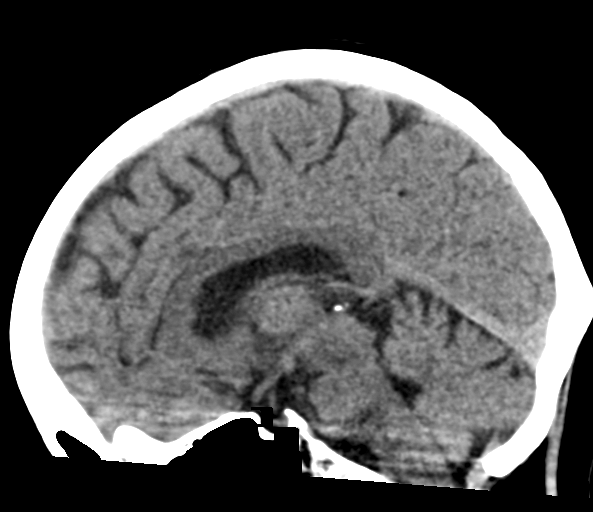
[im 33/49  brain]
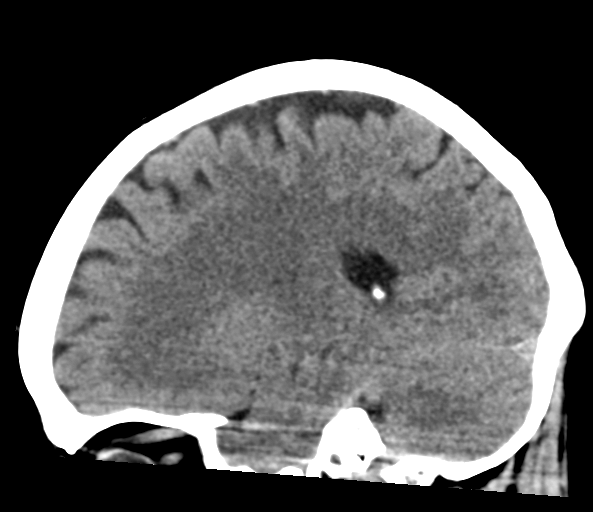

[16 of 44 positions shown; findings below may reference images not displayed]

FINDINGS: Brain: Ventricles and sulci appear symmetrical. No ventricular
dilatation. No mass effect or midline shift. No abnormal extra-axial
fluid collections. Gray-white matter junctions are distinct. Basal
cisterns are not effaced. No evidence of acute intracranial
hemorrhage.

Vascular: No hyperdense vessel or unexpected calcification.

Skull: No acute depressed skull fractures.

Sinuses/Orbits: No acute finding.

Other: None.
IMPRESSION: No acute intracranial abnormalities.

## 2019-07-27 ENCOUNTER — Ambulatory Visit: Payer: Self-pay

## 2021-12-23 ENCOUNTER — Other Ambulatory Visit: Payer: Self-pay | Admitting: Nurse Practitioner

## 2021-12-23 DIAGNOSIS — Z1231 Encounter for screening mammogram for malignant neoplasm of breast: Secondary | ICD-10-CM

## 2022-09-13 ENCOUNTER — Other Ambulatory Visit: Payer: Self-pay | Admitting: Nurse Practitioner

## 2022-09-13 DIAGNOSIS — Z1231 Encounter for screening mammogram for malignant neoplasm of breast: Secondary | ICD-10-CM
